# Patient Record
Sex: Male | Born: 1985 | Race: White | Hispanic: No | Marital: Single
Health system: Southern US, Community
[De-identification: ages and names within clinical notes are randomized; demographics above are authoritative.]

## PROBLEM LIST (undated history)

## (undated) DIAGNOSIS — F319 Bipolar disorder, unspecified: Secondary | ICD-10-CM

## (undated) DIAGNOSIS — E119 Type 2 diabetes mellitus without complications: Secondary | ICD-10-CM

## (undated) DIAGNOSIS — K219 Gastro-esophageal reflux disease without esophagitis: Secondary | ICD-10-CM

## (undated) DIAGNOSIS — F329 Major depressive disorder, single episode, unspecified: Secondary | ICD-10-CM

## (undated) DIAGNOSIS — F32A Depression, unspecified: Secondary | ICD-10-CM

---

## 2017-11-21 ENCOUNTER — Emergency Department (HOSPITAL_COMMUNITY): Payer: BLUE CROSS/BLUE SHIELD

## 2017-11-21 ENCOUNTER — Other Ambulatory Visit: Payer: Self-pay

## 2017-11-21 ENCOUNTER — Emergency Department (HOSPITAL_COMMUNITY)
Admission: EM | Admit: 2017-11-21 | Discharge: 2017-11-21 | Disposition: A | Discharge status: Home / Self Care | Payer: BLUE CROSS/BLUE SHIELD | Attending: Emergency Medicine | Admitting: Emergency Medicine

## 2017-11-21 ENCOUNTER — Encounter (HOSPITAL_COMMUNITY): Payer: Self-pay

## 2017-11-21 DIAGNOSIS — Z794 Long term (current) use of insulin: Secondary | ICD-10-CM | POA: Insufficient documentation

## 2017-11-21 DIAGNOSIS — R110 Nausea: Secondary | ICD-10-CM | POA: Insufficient documentation

## 2017-11-21 DIAGNOSIS — R1012 Left upper quadrant pain: Secondary | ICD-10-CM | POA: Diagnosis not present

## 2017-11-21 DIAGNOSIS — E1065 Type 1 diabetes mellitus with hyperglycemia: Secondary | ICD-10-CM | POA: Diagnosis not present

## 2017-11-21 DIAGNOSIS — Z79899 Other long term (current) drug therapy: Secondary | ICD-10-CM | POA: Insufficient documentation

## 2017-11-21 HISTORY — DX: Bipolar disorder, unspecified: F31.9

## 2017-11-21 HISTORY — DX: Type 2 diabetes mellitus without complications: E11.9

## 2017-11-21 HISTORY — DX: Major depressive disorder, single episode, unspecified: F32.9

## 2017-11-21 HISTORY — DX: Gastro-esophageal reflux disease without esophagitis: K21.9

## 2017-11-21 HISTORY — DX: Depression, unspecified: F32.A

## 2017-11-21 LAB — CBC WITH DIFFERENTIAL/PLATELET
Basophils Absolute: 0 10*3/uL (ref 0.0–0.1)
Basophils Relative: 0 %
EOS PCT: 0 %
Eosinophils Absolute: 0 10*3/uL (ref 0.0–0.7)
HEMATOCRIT: 44.6 % (ref 39.0–52.0)
Hemoglobin: 15.4 g/dL (ref 13.0–17.0)
LYMPHS ABS: 1.6 10*3/uL (ref 0.7–4.0)
LYMPHS PCT: 20 %
MCH: 31.4 pg (ref 26.0–34.0)
MCHC: 34.5 g/dL (ref 30.0–36.0)
MCV: 90.8 fL (ref 78.0–100.0)
Monocytes Absolute: 0.4 10*3/uL (ref 0.1–1.0)
Monocytes Relative: 5 %
Neutro Abs: 6 10*3/uL (ref 1.7–7.7)
Neutrophils Relative %: 75 %
PLATELETS: 212 10*3/uL (ref 150–400)
RBC: 4.91 MIL/uL (ref 4.22–5.81)
RDW: 12.1 % (ref 11.5–15.5)
WBC: 8 10*3/uL (ref 4.0–10.5)

## 2017-11-21 LAB — COMPREHENSIVE METABOLIC PANEL
ALT: 18 U/L (ref 17–63)
AST: 19 U/L (ref 15–41)
Albumin: 4.9 g/dL (ref 3.5–5.0)
Alkaline Phosphatase: 51 U/L (ref 38–126)
Anion gap: 6 (ref 5–15)
BUN: 21 mg/dL — AB (ref 6–20)
CHLORIDE: 100 mmol/L — AB (ref 101–111)
CO2: 31 mmol/L (ref 22–32)
CREATININE: 0.93 mg/dL (ref 0.61–1.24)
Calcium: 9.7 mg/dL (ref 8.9–10.3)
GFR calc non Af Amer: >60 mL/min (ref >60)
Glucose, Bld: 328 mg/dL — ABNORMAL HIGH (ref 65–99)
Potassium: 4.3 mmol/L (ref 3.5–5.1)
SODIUM: 137 mmol/L (ref 135–145)
Total Bilirubin: 1.2 mg/dL (ref 0.3–1.2)
Total Protein: 7.1 g/dL (ref 6.5–8.1)

## 2017-11-21 LAB — CBG MONITORING, ED: Glucose-Capillary: 262 mg/dL — ABNORMAL HIGH (ref 65–99)

## 2017-11-21 LAB — URINALYSIS, ROUTINE W REFLEX MICROSCOPIC
BACTERIA UA: NONE SEEN
Bilirubin Urine: NEGATIVE
Glucose, UA: >=500 mg/dL — AB
Hgb urine dipstick: NEGATIVE
Ketones, ur: 20 mg/dL — AB
Leukocytes, UA: NEGATIVE
NITRITE: NEGATIVE
PROTEIN: NEGATIVE mg/dL
Specific Gravity, Urine: 1.03 (ref 1.005–1.030)
pH: 5 (ref 5.0–8.0)

## 2017-11-21 LAB — LIPASE, BLOOD: Lipase: 31 U/L (ref 11–51)

## 2017-11-21 MED ORDER — ONDANSETRON HCL 4 MG/2ML IJ SOLN
4.0000 mg | Freq: Once | INTRAMUSCULAR | Status: AC
  Administered 2017-11-21: 4 mg via INTRAVENOUS
  Filled 2017-11-21: qty 2

## 2017-11-21 MED ORDER — SODIUM CHLORIDE 0.9 % IV BOLUS
500.0000 mL | Freq: Once | INTRAVENOUS | Status: AC
  Administered 2017-11-21: 500 mL via INTRAVENOUS

## 2017-11-21 MED ORDER — IOPAMIDOL (ISOVUE-300) INJECTION 61%
INTRAVENOUS | Status: AC
  Administered 2017-11-21: 100 mL via INTRAVENOUS
  Filled 2017-11-21: qty 100

## 2017-11-21 MED ORDER — FAMOTIDINE IN NACL 20-0.9 MG/50ML-% IV SOLN
20.0000 mg | Freq: Once | INTRAVENOUS | Status: AC
  Administered 2017-11-21: 20 mg via INTRAVENOUS
  Filled 2017-11-21: qty 50

## 2017-11-21 NOTE — ED Notes (Signed)
Patient discharged but [facility] on [facility] to [facility] before we can let him go. [facility] to [facility] to Ruben Pennington(Ruben Pennington).

## 2017-11-21 NOTE — ED Provider Notes (Signed)
[facility]EMERGENCY DEPT Provider Note   CSN: [id number] Arrival date & time: 11/21/17  7829     History   Chief Complaint No chief complaint on file.   HPI Ruben Pennington is a 32 y.o. male.  Ruben Pennington is a 32 y.o. Male who presents to the emergency department complaining of left upper quadrant abdominal pain ongoing for the past 6 weeks.  Patient reports he feels like he is swollen in this area.  He reports some nausea this morning, but no vomiting or diarrhea.  He denies previous abdominal surgeries.  He is a diabetic and is on continuous glucose monitor and pump.  He reports he ran out of his insulin cartridge this morning and gave himself a liter bolus.  This is why his blood sugar was 414 earlier today.  He is currently at [facility] for substance abuse treatment.  He reports he saw his primary care doctor for his left upper quadrant pain previously and had an x-ray that was unremarkable.  He denies any chest pain or shortness of breath.  No acid reflux symptoms.  He denies any changes to his bowel habits.  He reports his pain is constant.  No medications for treatment prior to arrival today.  Patient denies fevers, vomiting, diarrhea, chest pain, coughing, shortness of breath, urinary symptoms, rashes.   The history is provided by the patient and medical records. No language interpreter was used.    Past Medical History:  Diagnosis Date  . Bipolar disorder (HCC)   . Depression   . Diabetes mellitus without complication (HCC)    on insulin pump  . GERD (gastroesophageal reflux disease)     There are no active problems to display for this patient.    Home Medications    Prior to Admission medications   Medication Sig Start Date End Date Taking? Authorizing Provider  glucagon (GLUCAGON EMERGENCY) 1 MG injection Inject 1 application into the skin as needed. 11/28/16  Yes [provider]  insulin aspart (NOVOLOG) 100 UNIT/ML  injection Inject into the skin See admin instructions. Insulin pump:   Yes [provider]  omeprazole (PRILOSEC) 20 MG capsule Take 20 mg by mouth 2 (two) times daily. 03/22/17  Yes [provider]    Family History No family history on file.  Social History Social History   Tobacco Use  . Smoking status: Not on file  Substance Use Topics  . Alcohol use: Not on file  . Drug use: Yes    Types: Cocaine     Allergies   Sulfa antibiotics; Sulfamethoxazole-trimethoprim; Bacitracin-neomycin-polymyxin; and Neosporin [neomycin-bacitracin zn-polymyx]   Review of Systems Review of Systems  Constitutional: Negative for chills and fever.  HENT: Negative for congestion and sore throat.   Eyes: Negative for visual disturbance.  Respiratory: Negative for cough, shortness of breath and wheezing.   Cardiovascular: Negative for chest pain and palpitations.  Gastrointestinal: Positive for abdominal pain and nausea. Negative for blood in stool, diarrhea and vomiting.  Genitourinary: Negative for difficulty urinating, dysuria, frequency, penile pain and testicular pain.  Musculoskeletal: Negative for back pain and neck pain.  Skin: Negative for rash.  Neurological: Negative for headaches.     Physical Exam Updated Vital Signs BP (!) 144/82   Pulse 84   Temp 98.1 F (36.7 C) (Oral)   Resp 16   Ht 5\' 9"  (1.753 m)   Wt 65.8 kg (145 lb)   SpO2 99%   BMI 21.41 kg/m   Physical  Exam  Constitutional: He appears well-developed and well-nourished. No distress.  Nontoxic-appearing.  HENT:  Head: Normocephalic and atraumatic.  Mouth/Throat: Oropharynx is clear and moist.  Eyes: Pupils are equal, round, and reactive to light. Conjunctivae are normal. Right eye exhibits no discharge. Left eye exhibits no discharge.  Neck: Neck supple.  Cardiovascular: Normal rate, regular rhythm, normal heart sounds and intact distal pulses. Exam reveals no gallop and no friction rub.  No  murmur heard. Pulmonary/Chest: Effort normal and breath sounds normal. No stridor. No respiratory distress. He has no wheezes. He has no rales.  Lungs are clear to ascultation bilaterally. Symmetric chest expansion bilaterally. No increased work of breathing. No rales or rhonchi.    Abdominal: Soft. Bowel sounds are normal. He exhibits no distension and no mass. There is tenderness. There is no rebound and no guarding.  Abdomen is soft.  Bowel sounds are present.  Patient has gastric abdominal tenderness to palpation.  No peritoneal signs.  No abdominal swelling noted.  Musculoskeletal: He exhibits no edema or tenderness.  Lymphadenopathy:    He has no cervical adenopathy.  Neurological: He is alert. He exhibits normal muscle tone. Coordination normal.  Skin: Skin is warm and dry. No rash noted. He is not diaphoretic. No erythema. No pallor.  Psychiatric: He has a normal mood and affect. His behavior is normal.  Nursing note and vitals reviewed.    ED Treatments / Results  Labs (all labs ordered are listed, but only abnormal results are displayed) Labs Reviewed  URINALYSIS, ROUTINE W REFLEX MICROSCOPIC - Abnormal; Notable for the following components:      Result Value   Glucose, UA >=500 (*)    Ketones, ur 20 (*)    All other components within normal limits  COMPREHENSIVE METABOLIC PANEL - Abnormal; Notable for the following components:   Chloride 100 (*)    Glucose, Bld 328 (*)    BUN 21 (*)    All other components within normal limits  CBG MONITORING, ED - Abnormal; Notable for the following components:   Glucose-Capillary 262 (*)    All other components within normal limits  LIPASE, BLOOD  CBC WITH DIFFERENTIAL/PLATELET    EKG None  Radiology Ct Abdomen Pelvis W Contrast  Result Date: 11/21/2017 CLINICAL DATA:  Left upper quadrant pain for several weeks EXAM: CT ABDOMEN AND PELVIS WITH CONTRAST TECHNIQUE: Multidetector CT imaging of the abdomen and pelvis was performed  using the standard protocol following bolus administration of intravenous contrast. CONTRAST:  100 mL Isovue-300 COMPARISON:  None. FINDINGS: Lower chest: No acute abnormality. Hepatobiliary: No focal liver abnormality is seen. No gallstones, gallbladder wall thickening, or biliary dilatation. Pancreas: Unremarkable. No pancreatic ductal dilatation or surrounding inflammatory changes. Spleen: Normal in size without focal abnormality. Adrenals/Urinary Tract: Adrenal glands are unremarkable. Kidneys are normal, without renal calculi, focal lesion, or hydronephrosis. Bladder is unremarkable. Stomach/Bowel: Stomach is within normal limits. Appendix appears normal. No evidence of bowel wall thickening, distention, or inflammatory changes. Vascular/Lymphatic: No significant vascular findings are present. No enlarged abdominal or pelvic lymph nodes. Reproductive: Prostate is unremarkable. Other: No abdominal wall hernia or abnormality. No abdominopelvic ascites. Musculoskeletal: No acute bony abnormality is noted. IMPRESSION: No acute abnormality noted. Electronically Signed   By: Alcide Clever M.D.   On: 11/21/2017 11:39    Procedures Procedures (including critical care time)  Medications Ordered in ED Medications  sodium chloride 0.9 % bolus 500 mL (0 mLs Intravenous Stopped 11/21/17 1209)  ondansetron (ZOFRAN) injection 4 mg (  4 mg Intravenous Given 11/21/17 1043)  famotidine (PEPCID) IVPB 20 mg premix (0 mg Intravenous Stopped 11/21/17 1209)  iopamidol (ISOVUE-300) 61 % injection (100 mLs Intravenous Contrast Given 11/21/17 1117)     Initial Impression / Assessment and Plan / ED Course  I have reviewed the triage vital signs and the nursing notes.  Pertinent labs & imaging results that were available during my care of the patient were reviewed by me and considered in my medical decision making (see chart for details).    This who presents to the emergency department complaining of left upper quadrant  abdominal pain ongoing for the past 6 weeks.  Patient reports he feels like he is swollen in this area.  He reports some nausea this morning, but no vomiting or diarrhea.  He denies previous abdominal surgeries.  He is a diabetic and is on continuous glucose monitor and pump.  He reports he ran out of his insulin cartridge this morning and gave himself a liter bolus.  This is why his blood sugar was 414 earlier today.  He is currently at [facility] for substance abuse treatment.  He reports he saw his primary care doctor for his left upper quadrant pain previously and had an x-ray that was unremarkable.  He denies any chest pain or shortness of breath.  No acid reflux symptoms.  He denies any changes to his bowel habits.  He reports his pain is constant.  No medications for treatment prior to arrival today.  On exam the patient is afebrile and nontoxic-appearing.  His abdomen is soft and he has mild epigastric and left upper quadrant tenderness to palpation.  Lungs clear to auscultation bilaterally.  No increased work of breathing.  No CVA or flank tenderness. Differential includes peptic ulcer disease, GERD, pancreatitis, gastritis, gastroparesis, PE and pneumonia.  Patient denies any chest pain, coughing or shortness of breath.  I doubt pneumonia or PE. PERC criteria negative. Doubt PE.  He reports nausea. Will check labs and CT scan with contrast of abdomen and pelvis.  Urinalysis shows glucosuria.  No evidence of infection. CMP is remarkable only for glucose of 328 with a normal anion gap.  Repeat finger glucose is improved at 262.  Blood glucose is decreasing.  He has a continuous blood glucose monitor.  His sugar was elevated due to forgetting to replace his insulin cartridge this morning. No DKA.  Normal liver enzymes. Normal lipase. CBC is within normal limits.  No leukocytosis. CT abdomen pelvis with contrast shows no acute finding.  Stomach and bowel are within normal limits.  Appendix is  normal.  No bowel wall thickening.  No distention or inflammatory changes.  No evidence of inflammatory changes on blood work. Work-up here is reassuring.  I question a component of gastritis versus peptic ulcer disease.  I discussed the findings with the patient and his family.  I discussed diet instructions.  He is currently on omeprazole.  I encouraged him to follow-up with GI.  Patient tells me he has a GI doctor and can make an appointment for follow-up.  I discussed strict and specific return precautions. I advised the patient to follow-up with their primary care provider this week. I advised the patient to return to the emergency department with new or worsening symptoms or new concerns. The patient and his parents verbalized understanding and agreement with plan.     Final Clinical Impressions(s) / ED Diagnoses   Final diagnoses:  Abdominal pain, left upper quadrant  Nausea  Type 1 diabetes mellitus with hyperglycemia [facility](HCC)    ED Discharge Orders    None       Everlene FarrierDansie, Avonelle Viveros, PA-C 11/21/17 1303    Derwood KaplanNanavati, Ankit, MD 11/22/17 1502

## 2017-11-21 NOTE — ED Notes (Signed)
Bed: WA06 Expected date:  Expected time:  Means of arrival:  Comments: Pt from [facility], abdominal pain

## 2017-11-21 NOTE — ED Triage Notes (Signed)
Patient presented to ed with c/o upper left quadrant pain stated 6 weeks ago. Patient c/o 6/10 pain. Patient cbg was 414 and pt state he give him self 10.4 units insulin bolus with insulin pump.

## 2017-11-21 NOTE — Discharge Instructions (Addendum)
Please read and follow all provided instructions.  Your diagnoses today include:  1. Abdominal pain, left upper quadrant   2. Nausea   3. Type 1 diabetes mellitus with hyperglycemia (HCC)    CT scan was normal. Blood work showed some elevated blood glucose, but we suspect this is because you forgot to change your insulin today. Normal white count and other blood work. No DKA. Please follow up with GI and primary care.   Tests performed today include: Blood counts and electrolytes Blood tests to check liver and kidney function Blood tests to check pancreas function Vital signs. See below for your results today.   Medications prescribed:   Take any prescribed medications only as directed.  Home care instructions:  Follow any educational materials contained in this packet.  Follow-up instructions: Please follow-up with your primary care provider in the next 2 days for further evaluation of your symptoms.    Return instructions:  SEEK IMMEDIATE MEDICAL ATTENTION IF: The pain does not go away or becomes severe  A temperature above 101F develops  Repeated vomiting occurs (multiple episodes)  The pain becomes localized to portions of the abdomen. The right side could possibly be appendicitis. In an adult, the left lower portion of the abdomen could be colitis or diverticulitis.  Blood is being passed in stools or vomit (bright red or black tarry stools)  You develop chest pain, difficulty breathing, dizziness or fainting, or become confused, poorly responsive, or inconsolable (young children) If you have any other emergent concerns regarding your health  Additional Information: Abdominal (belly) pain can be caused by many things. Your caregiver performed an examination and possibly ordered blood/urine tests and imaging (CT scan, x-rays, ultrasound). Many cases can be observed and treated at home after initial evaluation in the emergency department. Even though you are being discharged  home, abdominal pain can be unpredictable. Therefore, you need a repeated exam if your pain does not resolve, returns, or worsens. Most patients with abdominal pain don't have to be admitted to the hospital or have surgery, but serious problems like appendicitis and gallbladder attacks can start out as nonspecific pain. Many abdominal conditions cannot be diagnosed in one visit, so follow-up evaluations are very important.  Your vital signs today were: BP (!) 144/82    Pulse 84    Temp 98.1 F (36.7 C) (Oral)    Resp 16    Ht 5\' 9"  (1.753 m)    Wt 65.8 kg (145 lb)    SpO2 99%    BMI 21.41 kg/m  If your blood pressure (bp) was elevated above 135/85 this visit, please have this repeated by your doctor within one month. --------------

## 2019-01-03 IMAGING — CT CT ABD-PELV W/ CM
2 of 4 series · 16 of 46 positions shown, 18 images · IV contrast (ISOVUE)
Comparison: None.

CLINICAL DATA: Left upper quadrant pain for several weeks

EXAM:
CT ABDOMEN AND PELVIS WITH CONTRAST
TECHNIQUE: Multidetector CT imaging of the abdomen and pelvis was performed
using the standard protocol following bolus administration of
intravenous contrast.
CONTRAST:  100 mL Esovue-LRR

[Series 2: axial st · axial · 0.76mm/px · z∈[+1104,+1504]mm · 13 of 92 slices shown, 15 images]
[im 6/92  soft-tissue]
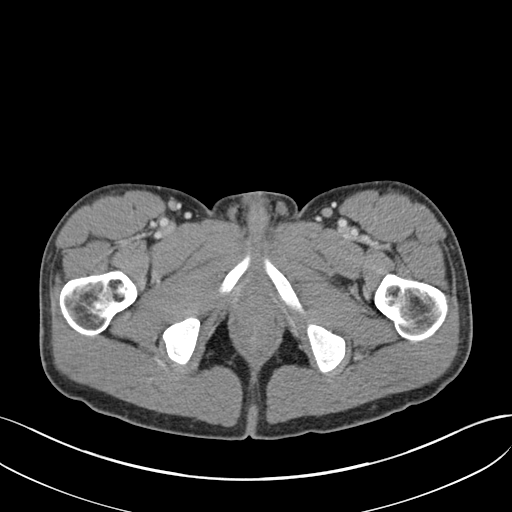
[im 6/92  bone]
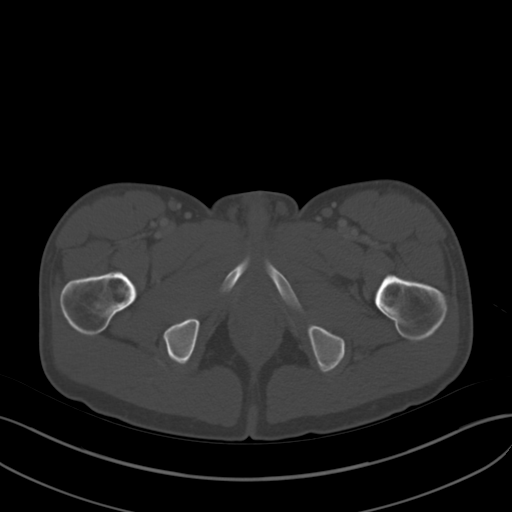
[im 11/92  soft-tissue]
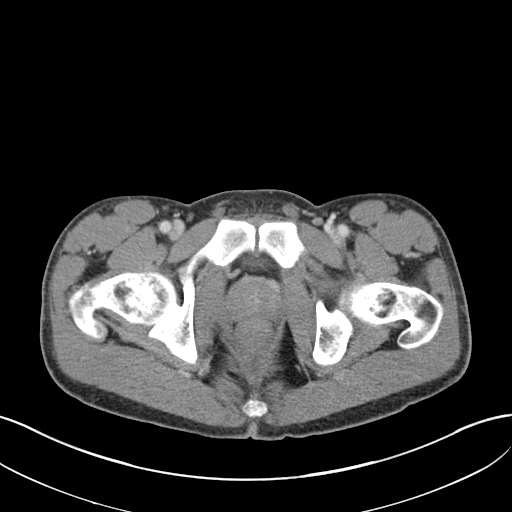
[im 21/92  soft-tissue]
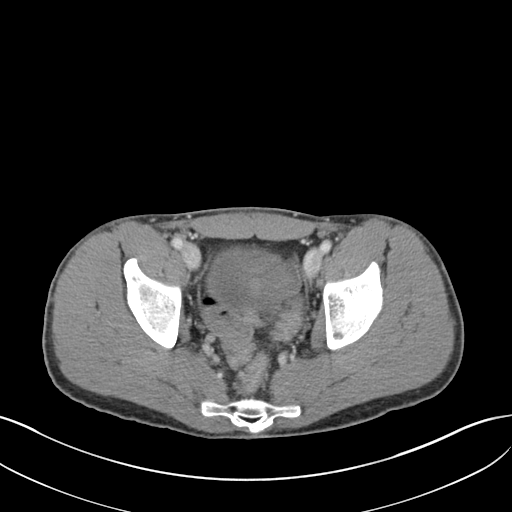
[im 26/92  soft-tissue]
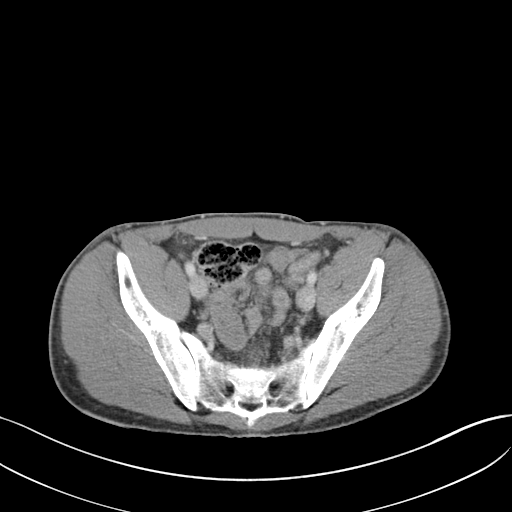
[im 31/92  soft-tissue]
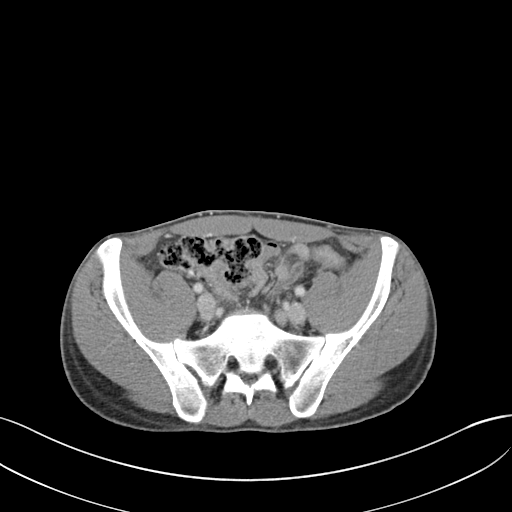
[im 41/92  soft-tissue]
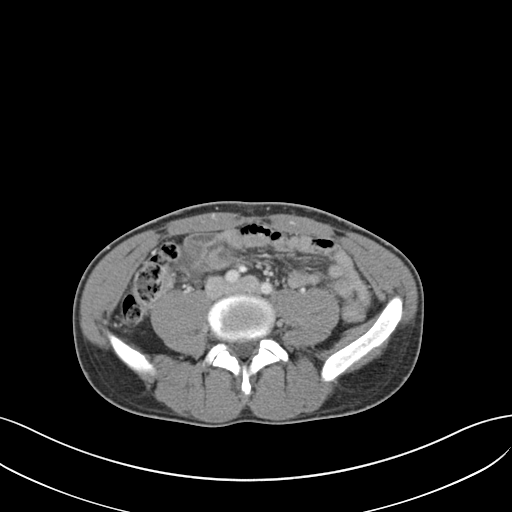
[im 46/92  soft-tissue]
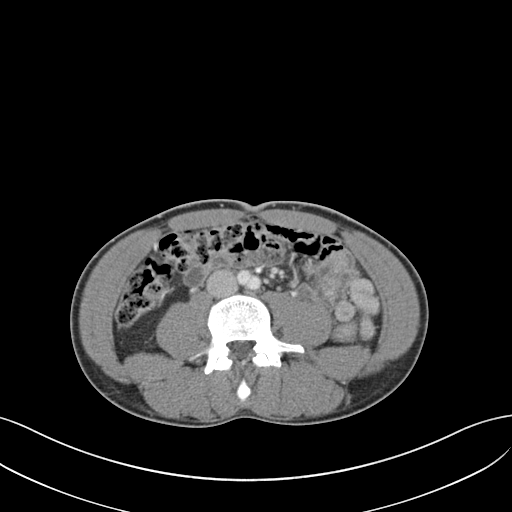
[im 51/92  soft-tissue]
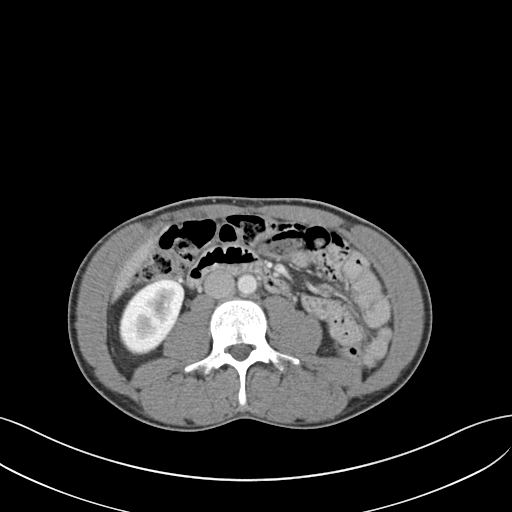
[im 61/92  soft-tissue]
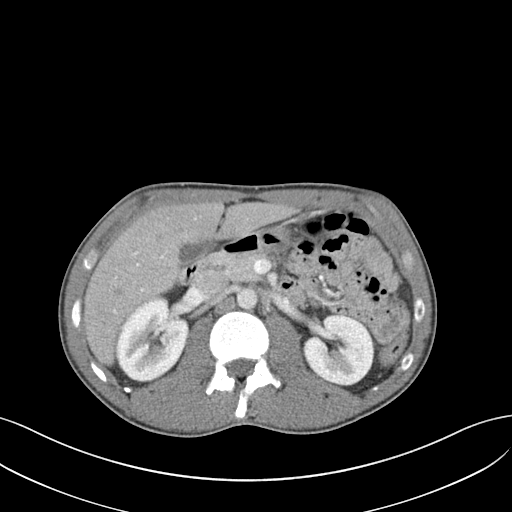
[im 61/92  bone]
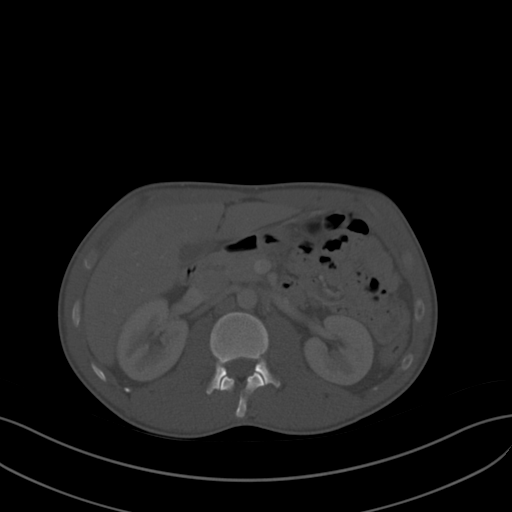
[im 66/92  soft-tissue]
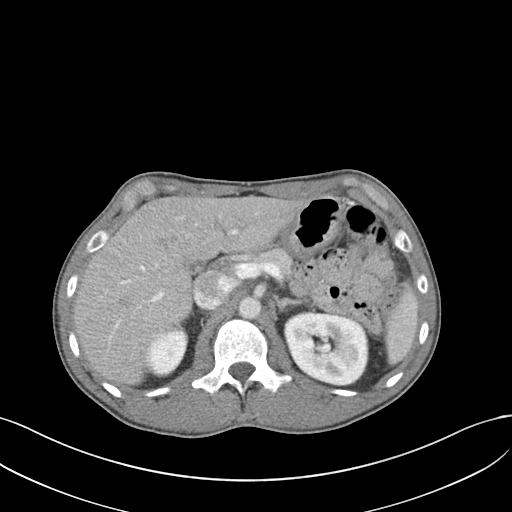
[im 71/92  soft-tissue]
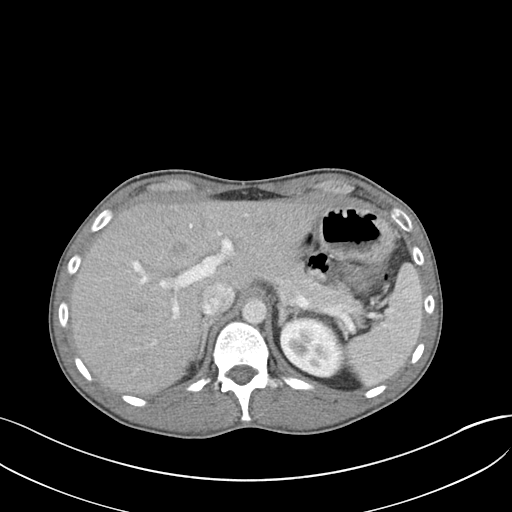
[im 81/92  soft-tissue]
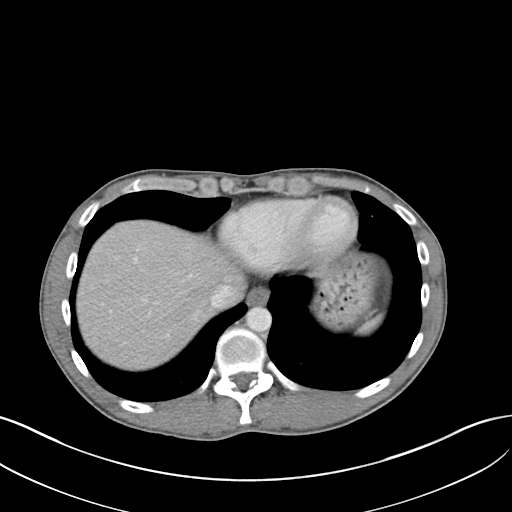
[im 86/92  soft-tissue]
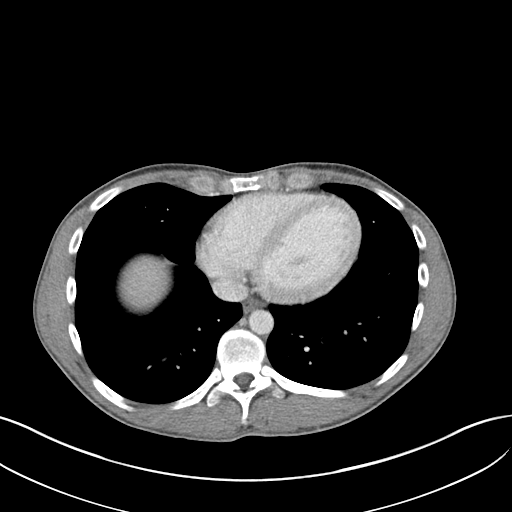

[Series 4: coronal st · coronal · 0.70mm/px · 3 of 72 slices shown]
[im 24/72  soft-tissue]
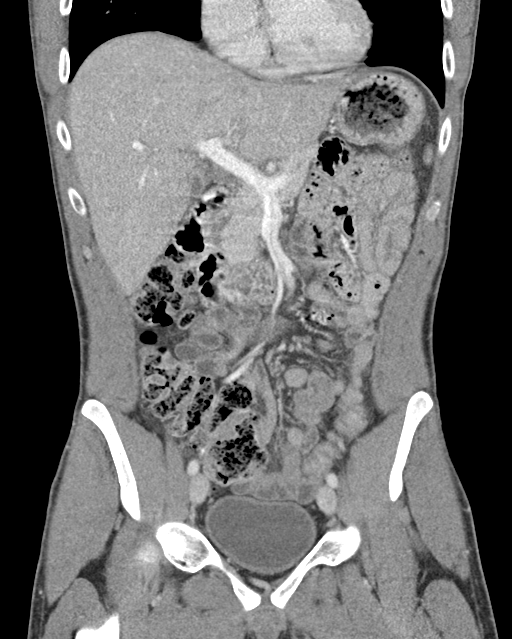
[im 32/72  soft-tissue]
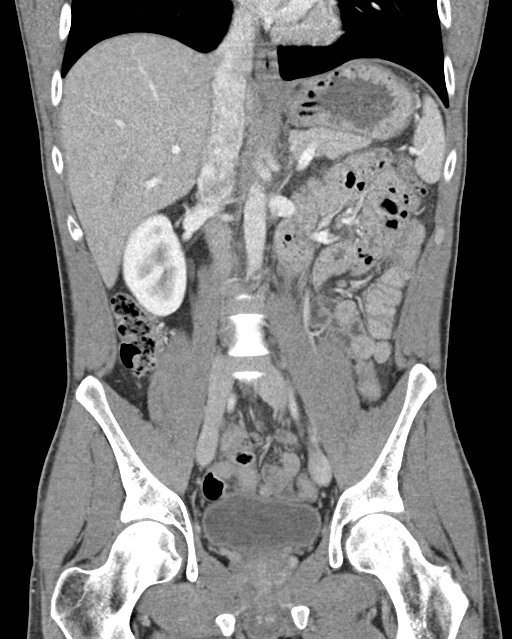
[im 40/72  soft-tissue]
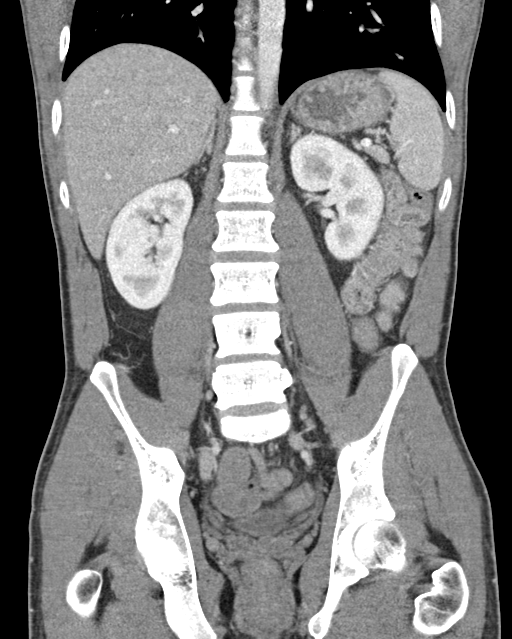

[16 of 46 positions shown; findings below may reference images not displayed]

FINDINGS: Lower chest: No acute abnormality.

Hepatobiliary: No focal liver abnormality is seen. No gallstones,
gallbladder wall thickening, or biliary dilatation.

Pancreas: Unremarkable. No pancreatic ductal dilatation or
surrounding inflammatory changes.

Spleen: Normal in size without focal abnormality.

Adrenals/Urinary Tract: Adrenal glands are unremarkable. Kidneys are
normal, without renal calculi, focal lesion, or hydronephrosis.
Bladder is unremarkable.

Stomach/Bowel: Stomach is within normal limits. Appendix appears
normal. No evidence of bowel wall thickening, distention, or
inflammatory changes.

Vascular/Lymphatic: No significant vascular findings are present. No
enlarged abdominal or pelvic lymph nodes.

Reproductive: Prostate is unremarkable.

Other: No abdominal wall hernia or abnormality. No abdominopelvic
ascites.

Musculoskeletal: No acute bony abnormality is noted.
IMPRESSION: No acute abnormality noted.
